# Patient Record
Sex: Male | Born: 1965 | Race: Black or African American | Hispanic: No | Marital: Single
Health system: Southern US, Community
[De-identification: ages and names within clinical notes are randomized; demographics above are authoritative.]

---

## 2019-08-21 ENCOUNTER — Other Ambulatory Visit: Payer: Self-pay

## 2019-08-21 ENCOUNTER — Emergency Department (HOSPITAL_COMMUNITY): Payer: Medicaid Other

## 2019-08-21 ENCOUNTER — Emergency Department (HOSPITAL_COMMUNITY)
Admission: EM | Admit: 2019-08-21 | Discharge: 2019-08-21 | Disposition: A | Discharge status: Home / Self Care | Payer: Medicaid Other | Attending: Emergency Medicine | Admitting: Emergency Medicine

## 2019-08-21 DIAGNOSIS — I10 Essential (primary) hypertension: Secondary | ICD-10-CM | POA: Insufficient documentation

## 2019-08-21 DIAGNOSIS — R600 Localized edema: Secondary | ICD-10-CM | POA: Diagnosis not present

## 2019-08-21 DIAGNOSIS — Z79899 Other long term (current) drug therapy: Secondary | ICD-10-CM | POA: Diagnosis not present

## 2019-08-21 DIAGNOSIS — R2240 Localized swelling, mass and lump, unspecified lower limb: Secondary | ICD-10-CM | POA: Diagnosis present

## 2019-08-21 DIAGNOSIS — Z7984 Long term (current) use of oral hypoglycemic drugs: Secondary | ICD-10-CM | POA: Diagnosis not present

## 2019-08-21 DIAGNOSIS — R0602 Shortness of breath: Secondary | ICD-10-CM | POA: Insufficient documentation

## 2019-08-21 DIAGNOSIS — E119 Type 2 diabetes mellitus without complications: Secondary | ICD-10-CM | POA: Diagnosis not present

## 2019-08-21 LAB — BRAIN NATRIURETIC PEPTIDE: B Natriuretic Peptide: 7.9 pg/mL (ref 0.0–100.0)

## 2019-08-21 LAB — CBC
HCT: 41.1 % (ref 39.0–52.0)
Hemoglobin: 13 g/dL (ref 13.0–17.0)
MCH: 28.6 pg (ref 26.0–34.0)
MCHC: 31.6 g/dL (ref 30.0–36.0)
MCV: 90.5 fL (ref 80.0–100.0)
Platelets: 298 10*3/uL (ref 150–400)
RBC: 4.54 MIL/uL (ref 4.22–5.81)
RDW: 15.1 % (ref 11.5–15.5)
WBC: 9.2 10*3/uL (ref 4.0–10.5)
nRBC: 0 % (ref 0.0–0.2)

## 2019-08-21 LAB — BASIC METABOLIC PANEL
Anion gap: 9 (ref 5–15)
BUN: 14 mg/dL (ref 6–20)
CO2: 28 mmol/L (ref 22–32)
Calcium: 8.8 mg/dL — ABNORMAL LOW (ref 8.9–10.3)
Chloride: 102 mmol/L (ref 98–111)
Creatinine, Ser: 1.17 mg/dL (ref 0.61–1.24)
GFR calc Af Amer: >60 mL/min (ref >60)
GFR calc non Af Amer: >60 mL/min (ref >60)
Glucose, Bld: 169 mg/dL — ABNORMAL HIGH (ref 70–99)
Potassium: 4.1 mmol/L (ref 3.5–5.1)
Sodium: 139 mmol/L (ref 135–145)

## 2019-08-21 LAB — CBG MONITORING, ED: Glucose-Capillary: 171 mg/dL — ABNORMAL HIGH (ref 70–99)

## 2019-08-21 MED ORDER — SODIUM CHLORIDE 0.9% FLUSH
3.0000 mL | Freq: Once | INTRAVENOUS | Status: AC
  Administered 2019-08-21: 3 mL via INTRAVENOUS

## 2019-08-21 NOTE — Discharge Instructions (Addendum)
Your work-up today was overall reassuring, there are no new signs of heart failure, and you have previously had a ultrasound of your lower extremities done to check for blood clots.  I do not think that you have an active blood clot at this time as this has been an ongoing long term problem.  Please make sure to keep your appointment with your doctor for further management of your lymphedema.  Return to the ER if your symptoms worsen.

## 2019-08-21 NOTE — ED Triage Notes (Signed)
Pt states that he has had swelling in his legs and "fluid" buildup x 1 month. Pt states he was in therapy for a few months, but has not been x 1 month. Pt also states he has lymphedema. Pt is not from this area, he is visiting family and wanted another opinion.

## 2019-08-21 NOTE — ED Provider Notes (Signed)
[facility]-EMERGENCY DEPT Provider Note   CSN: [id number] Arrival date & time: 08/21/19  1208     History Chief Complaint  Patient presents with  . Leg Swelling    Jon Foster is a 54 y.o. male.  HPI 54 year old male with a history of DM type II, lymphedema, but limited medical history given no records in the EMR presents to the ER with bilateral swelling to his lower extremities for the last 2 months.  He states that he has been diagnosed with lymphedema, and follows with a physician for this.  Chart review is limited, however he does have a diagnosis of lymphedema as seen in his chart for over a year.  He states that his legs have stayed swollen over the last 2 months, he has had no new worsening or concerning symptoms.  No erythema, fevers, chills.  He states that he has had a ultrasound study done of his legs for DVT rule out which was negative several months ago.  He lives in [place] and states he is here just for a second opinion.  He states that he is on a "fluid pill" that he takes regularly.  He said that he read on the Internet that his legs could be drained.  He states that he is here as he is having some pain to his legs and his legs are rubbing up against each other which is causing him to be uncomfortable.  He also endorses some gradual shortness of breath over the last few months.  Denies any history of heart failure.  He has no chest pain, diaphoresis, no recent travel, or surgeries.  No abdominal pain, nausea, vomiting, dysuria, hematuria, headaches, dizziness, cough.    No past medical history on file.  There are no problems to display for this patient.    The histories are not reviewed yet. Please review them in the "History" navigator section and refresh this SmartLink.     No family history on file.  Social History   Tobacco Use  . Smoking status: Not on file  Substance Use Topics  . Alcohol use: Not on file  . Drug use: Not on file     Home Medications Prior to Admission medications   Medication Sig Start Date End Date Taking? Authorizing Provider  albuterol (VENTOLIN HFA) 108 (90 Base) MCG/ACT inhaler Inhale 2 puffs into the lungs every 6 (six) hours as needed for wheezing or shortness of breath.   Yes [provider]  Ascorbic Acid (VITAMIN C PO) Take 1 tablet by mouth daily.   Yes [provider]  FARXIGA 10 MG TABS tablet Take 10 mg by mouth daily. 06/24/19  Yes [provider]  ferrous sulfate 325 (65 FE) MG EC tablet Take 325 mg by mouth in the morning and at bedtime.   Yes [provider]  folic acid (FOLVITE) 1 MG tablet Take 1 mg by mouth daily.   Yes [provider]  furosemide (LASIX) 40 MG tablet Take 40 mg by mouth daily. 06/30/19  Yes [provider]  glucose blood (ACCU-CHEK GUIDE) test strip See admin instructions. 08/10/18  Yes [provider]  losartan (COZAAR) 100 MG tablet Take 100 mg by mouth daily. 05/23/19  Yes [provider]  omeprazole (PRILOSEC) 20 MG capsule Take 20 mg by mouth every morning. 08/11/19  Yes [provider]  phentermine (ADIPEX-P) 37.5 MG tablet Take 37.5 mg by mouth daily before breakfast.   Yes [provider]  rosuvastatin (CRESTOR) 5 MG tablet Take 5 mg by mouth daily. 05/16/19  Yes [provider]  TRULICITY 0.75 MG/0.5ML SOPN Inject into the skin once a week. 08/05/19  Yes [provider]  vitamin B-12 (CYANOCOBALAMIN) 1000 MCG tablet Take 1,000 mcg by mouth daily.   Yes [provider]  cetirizine (ZYRTEC) 10 MG tablet Take 10 mg by mouth daily. Patient not taking: Reported on 08/21/2019 04/14/19   [provider]  gabapentin (NEURONTIN) 300 MG capsule TAKE (1) CAPSULE BY MOUTH THREE TIMES DAILY Patient not taking: Reported on 08/21/2019 06/24/19   [provider]  HYDROcodone-acetaminophen (NORCO/VICODIN) 5-325 MG tablet Take 1 tablet by mouth every 6  (six) hours as needed. Patient not taking: Reported on 08/21/2019 08/03/19   [provider]  JANUVIA 100 MG tablet Take 100 mg by mouth daily. Patient not taking: Reported on 08/21/2019 05/23/19   [provider]    Allergies    Patient has no known allergies.  Review of Systems   Review of Systems  Constitutional: Negative for chills and fever.  HENT: Negative for ear pain and sore throat.   Eyes: Negative for pain and visual disturbance.  Respiratory: Positive for shortness of breath. Negative for cough.   Cardiovascular: Positive for leg swelling. Negative for chest pain and palpitations.  Gastrointestinal: Negative for abdominal pain and vomiting.  Genitourinary: Negative for dysuria and hematuria.  Musculoskeletal: Negative for arthralgias and back pain.  Skin: Negative for color change and rash.  Neurological: Negative for seizures and syncope.  All other systems reviewed and are negative.   Physical Exam Updated Vital Signs BP 139/84 (BP Location: Left Arm)   Pulse 92   Temp 98.7 F (37.1 C) (Oral)   Resp 18   Ht 5\' 11"  (1.803 m)   Wt (!) 173.7 kg   SpO2 95%   BMI 53.42 kg/m   Physical Exam Vitals and nursing note reviewed.  Constitutional:      General: He is not in acute distress.    Appearance: Normal appearance. He is well-developed. He is obese. He is ill-appearing (Chronically ill-appearing). He is not toxic-appearing or diaphoretic.  HENT:     Head: Normocephalic and atraumatic.     Mouth/Throat:     Mouth: Mucous membranes are moist.     Pharynx: Oropharynx is clear.  Eyes:     Conjunctiva/sclera: Conjunctivae normal.  Cardiovascular:     Rate and Rhythm: Normal rate and regular rhythm.     Pulses: Normal pulses.     Heart sounds: Normal heart sounds. No murmur heard.   Pulmonary:     Effort: Pulmonary effort is normal. No respiratory distress.     Breath sounds: Normal breath sounds.  Abdominal:     General: Abdomen is flat.      Palpations: Abdomen is soft.     Tenderness: There is no abdominal tenderness.  Musculoskeletal:        General: Swelling and signs of injury present. No tenderness. Normal range of motion.     Cervical back: Normal range of motion and neck supple.     Right lower leg: Edema present.     Left lower leg: Edema present.     Comments: Right lower extremities with 3+ sitting edema extending to his thighs bilaterally.  No evidence of erythema, fluctuance, discharge.  Lower extremities with very dry skin and poor nail condition  Skin:    General: Skin is warm and dry.     Capillary  Refill: Capillary refill takes less than 2 seconds.     Findings: No erythema or rash.  Neurological:     General: No focal deficit present.     Mental Status: He is alert and oriented to person, place, and time.     Sensory: No sensory deficit.     Motor: No weakness.  Psychiatric:        Mood and Affect: Mood normal.        Behavior: Behavior normal.     ED Results / Procedures / Treatments   Labs (all labs ordered are listed, but only abnormal results are displayed) Labs Reviewed  BASIC METABOLIC PANEL - Abnormal; Notable for the following components:      Result Value   Glucose, Bld 169 (*)    Calcium 8.8 (*)    All other components within normal limits  CBG MONITORING, ED - Abnormal; Notable for the following components:   Glucose-Capillary 171 (*)    All other components within normal limits  CBC  BRAIN NATRIURETIC PEPTIDE    EKG EKG Interpretation  Date/Time:  Sunday August 21 2019 12:27:17 EDT Ventricular Rate:  95 PR Interval:    QRS Duration: 97 QT Interval:  366 QTC Calculation: 461 R Axis:   56 Text Interpretation: Sinus rhythm Abnormal R-wave progression, early transition 12 Lead; Mason-Likar No old tracing to compare Confirmed by Wentz, Elliott ([id number]) on 08/21/2019 2:03:19 PM   Radiology DG Chest 2 View  Result Date: 08/21/2019 CLINICAL DATA:  Leg swelling for the past month.  EXAM: CHEST - 2 VIEW COMPARISON:  None. FINDINGS: The heart size and mediastinal contours are within normal limits. Both lungs are clear. The visualized skeletal structures are unremarkable. IMPRESSION: Normal examination. Electronically Signed   By: Steven  Reid M.D.   On: 08/21/2019 12:52    Procedures Procedures (including critical care time)  Medications Ordered in ED Medications  sodium chloride flush (NS) 0.9 % injection 3 mL (3 mLs Intravenous Given 08/21/19 1302)    ED Course  I have reviewed the triage vital signs and the nursing notes.  Pertinent labs & imaging results that were available during my care of the patient were reviewed by me and considered in my medical decision making (see chart for details).    MDM Rules/Calculators/A&P                         54  year old with gradual bilateral leg swelling over the last 2 to 3 months. On presentation, the patient is a chronically ill-appearing, obese male but in no acute distress, speaking in  full sentences without increased work of breathing.  Vitals are reassuring, he is mildly hypertensive, but not tachycardic or tachypneic on my exam, with 97% oxygen saturations.  He has significant 3+ pitting edema in his lower extremities, but no focal tenderness, evidence of cellulitis, abscesses, no unilateral leg swelling, negative Homans.  My suspicion for DVT is low at this time, as the patient's symptoms have been ongoing and chronic.  He states that he has had a DVT study before and this was negative.  He is not tachycardic, tachypneic, doubt PE at this time.  Given this gradual shortness of breath over the last few months, ordered basic labs BNP chest x-ray and EKG to rule out new onset heart failure.  These were all within normal limits, with a mildly elevated glucose of 169.  EKG was normal sinus rhythm and chest x-ray was without any cardiomegaly  or fluid accumulation.  Doubt CHF at this time.  Patient states that he has a follow-up with  his physician on July 9.  I explained to the patient that his work-up was overall reassuring here in the ED today, and we do not drain lymphedema here in the ER.  He voices understanding and is agreeable.  I encouraged him to keep his follow-up with his doctor, he may need additional readjustment of his Lasix dosage, however will defer this to his doctor.  Return precautions given.  Patient voices understanding is agreeable to this plan.  Discussed the case with Dr. Stevie Kern who is agreeable to the above plan and disposition. Final Clinical Impression(s) / ED Diagnoses Final diagnoses:  Leg edema    Rx / DC Orders ED Discharge Orders    None       Leone Brand 08/21/19 1521    Milagros Loll, MD 08/22/19 1515    Milagros Loll, MD 08/22/19 [phone]

## 2021-08-03 IMAGING — CR DG CHEST 2V
2 series · 2 of 2 positions shown · non-contrast
Comparison: None.

CLINICAL DATA: Leg swelling for the past month.

EXAM:
CHEST - 2 VIEW

[w chest lat]
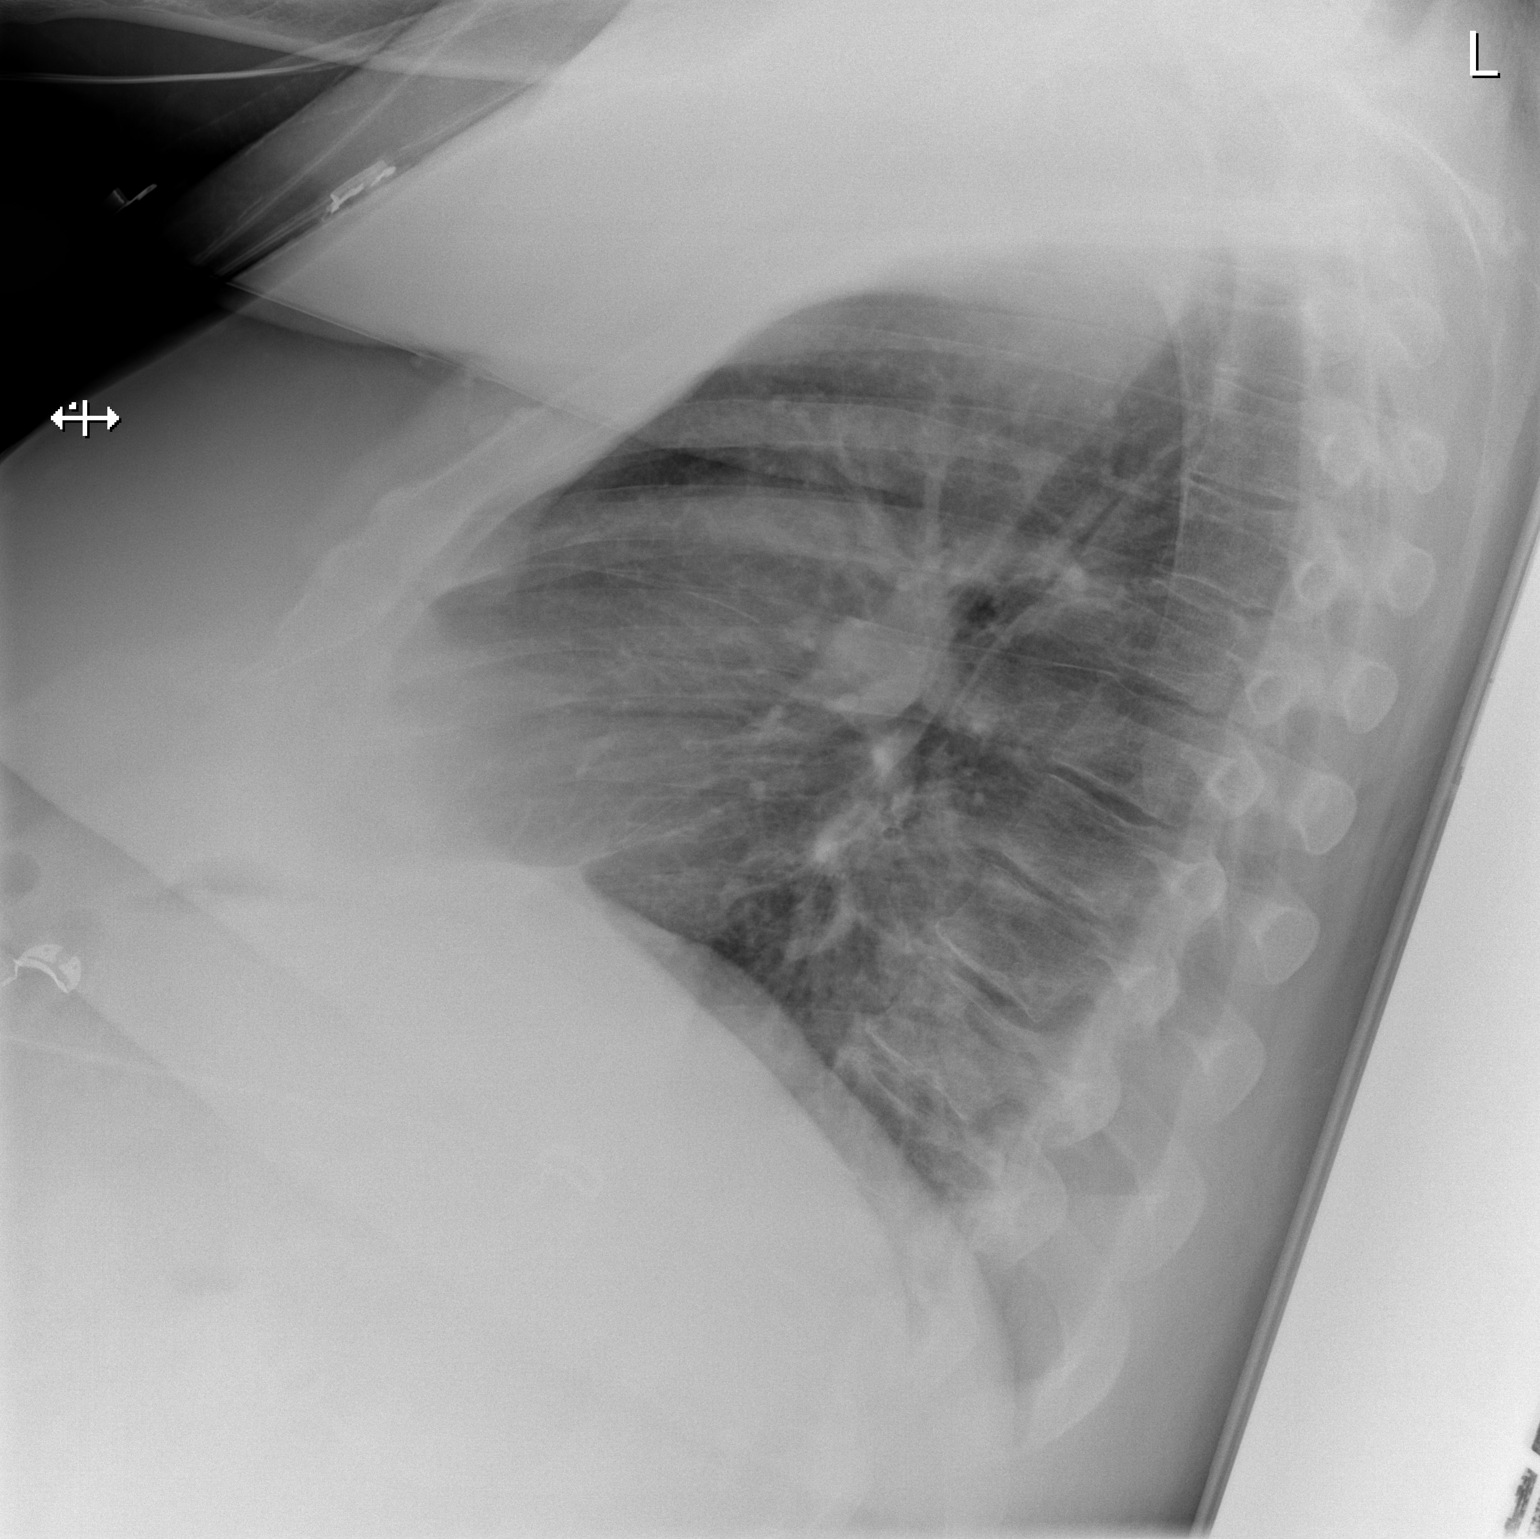

[x chest ap]
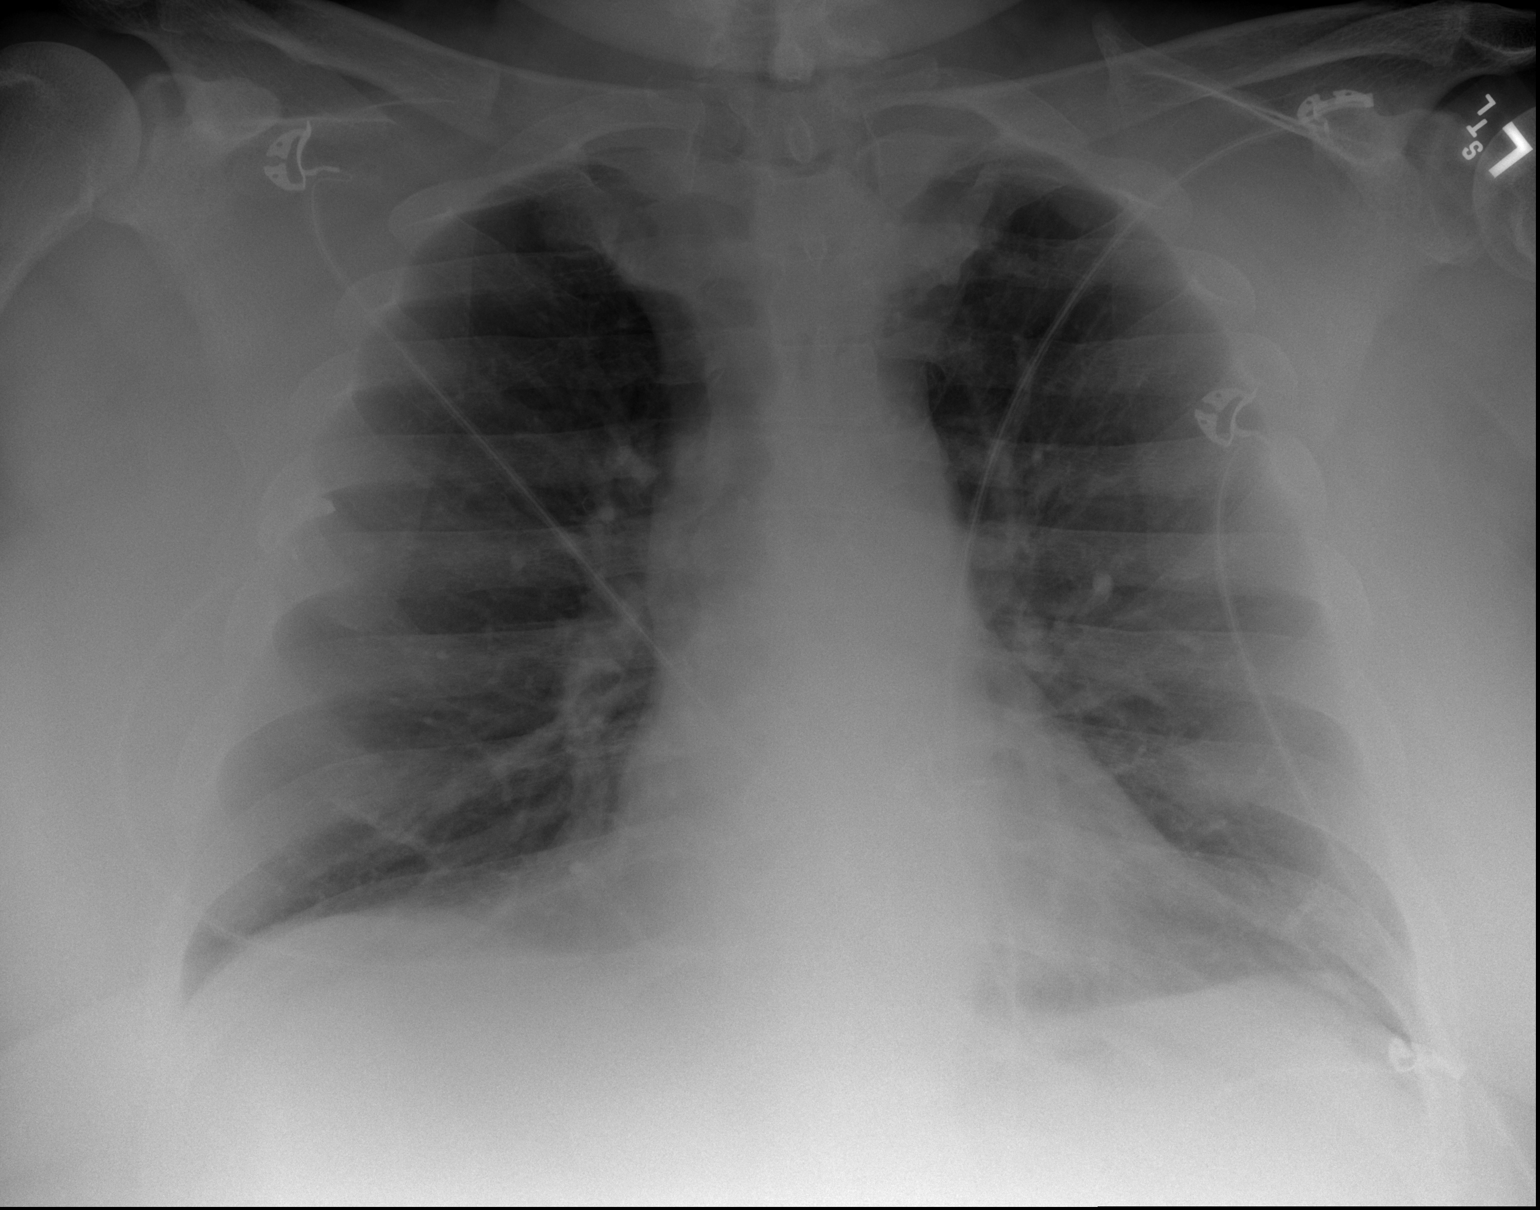

[2 of 2 positions shown; findings below may reference images not displayed]

FINDINGS: The heart size and mediastinal contours are within normal limits.
Both lungs are clear. The visualized skeletal structures are
unremarkable.
IMPRESSION: Normal examination.
